# Patient Record
Sex: Female | Born: 1990 | Hispanic: No | Marital: Single | Smoking: Never smoker
Health system: Southern US, Community
[De-identification: ages and names within clinical notes are randomized; demographics above are authoritative.]

## PROBLEM LIST (undated history)

## (undated) DIAGNOSIS — R42 Dizziness and giddiness: Secondary | ICD-10-CM

## (undated) DIAGNOSIS — E236 Other disorders of pituitary gland: Secondary | ICD-10-CM

## (undated) DIAGNOSIS — E221 Hyperprolactinemia: Secondary | ICD-10-CM

## (undated) DIAGNOSIS — R519 Headache, unspecified: Secondary | ICD-10-CM

## (undated) HISTORY — DX: Other disorders of pituitary gland: E23.6

## (undated) HISTORY — DX: Dizziness and giddiness: R42

## (undated) HISTORY — DX: Hyperprolactinemia: E22.1

## (undated) HISTORY — DX: Headache, unspecified: R51.9

---

## 2016-04-15 ENCOUNTER — Emergency Department (HOSPITAL_BASED_OUTPATIENT_CLINIC_OR_DEPARTMENT_OTHER): Payer: BLUE CROSS/BLUE SHIELD

## 2016-04-15 ENCOUNTER — Emergency Department (HOSPITAL_BASED_OUTPATIENT_CLINIC_OR_DEPARTMENT_OTHER)
Admission: EM | Admit: 2016-04-15 | Discharge: 2016-04-15 | Disposition: A | Discharge status: Home / Self Care | Payer: BLUE CROSS/BLUE SHIELD | Attending: Emergency Medicine | Admitting: Emergency Medicine

## 2016-04-15 ENCOUNTER — Encounter (HOSPITAL_BASED_OUTPATIENT_CLINIC_OR_DEPARTMENT_OTHER): Payer: Self-pay | Admitting: *Deleted

## 2016-04-15 DIAGNOSIS — R310 Gross hematuria: Secondary | ICD-10-CM | POA: Insufficient documentation

## 2016-04-15 DIAGNOSIS — Y999 Unspecified external cause status: Secondary | ICD-10-CM | POA: Diagnosis not present

## 2016-04-15 DIAGNOSIS — Y939 Activity, unspecified: Secondary | ICD-10-CM | POA: Diagnosis not present

## 2016-04-15 DIAGNOSIS — X58XXXA Exposure to other specified factors, initial encounter: Secondary | ICD-10-CM | POA: Diagnosis not present

## 2016-04-15 DIAGNOSIS — S39012A Strain of muscle, fascia and tendon of lower back, initial encounter: Secondary | ICD-10-CM

## 2016-04-15 DIAGNOSIS — R109 Unspecified abdominal pain: Secondary | ICD-10-CM | POA: Diagnosis not present

## 2016-04-15 DIAGNOSIS — Y929 Unspecified place or not applicable: Secondary | ICD-10-CM | POA: Insufficient documentation

## 2016-04-15 DIAGNOSIS — S3992XA Unspecified injury of lower back, initial encounter: Secondary | ICD-10-CM | POA: Diagnosis present

## 2016-04-15 LAB — URINALYSIS, ROUTINE W REFLEX MICROSCOPIC
Bilirubin Urine: NEGATIVE
GLUCOSE, UA: NEGATIVE mg/dL
KETONES UR: NEGATIVE mg/dL
LEUKOCYTES UA: NEGATIVE
Nitrite: NEGATIVE
PH: 5.5 (ref 5.0–8.0)
PROTEIN: NEGATIVE mg/dL
Specific Gravity, Urine: 1.025 (ref 1.005–1.030)

## 2016-04-15 LAB — URINE MICROSCOPIC-ADD ON: WBC UA: NONE SEEN WBC/hpf (ref 0–5)

## 2016-04-15 LAB — PREGNANCY, URINE: Preg Test, Ur: NEGATIVE

## 2016-04-15 MED ORDER — IBUPROFEN 400 MG PO TABS
600.0000 mg | ORAL_TABLET | Freq: Once | ORAL | Status: AC
  Administered 2016-04-15: 600 mg via ORAL
  Filled 2016-04-15: qty 1

## 2016-04-15 MED ORDER — METHOCARBAMOL 500 MG PO TABS
1000.0000 mg | ORAL_TABLET | Freq: Once | ORAL | Status: AC
Start: 2016-04-15 — End: 2016-04-15
  Administered 2016-04-15: 1000 mg via ORAL
  Filled 2016-04-15: qty 2

## 2016-04-15 MED ORDER — IBUPROFEN 600 MG PO TABS: 600.0000 mg | ORAL_TABLET | Freq: Four times a day (QID) | ORAL | 0 refills | Status: DC | PRN

## 2016-04-15 MED ORDER — METHOCARBAMOL 500 MG PO TABS: 1000.0000 mg | ORAL_TABLET | Freq: Two times a day (BID) | ORAL | 0 refills | Status: DC

## 2016-04-15 NOTE — ED Notes (Signed)
MD at bedside. 

## 2016-04-15 NOTE — ED Provider Notes (Signed)
[facility] Note   CSN: [id number] Arrival date & time: 04/15/16  1748   By signing my name below, I, Valentino Saxon, attest that this documentation has been prepared under the direction and in the presence of Loren Racer, MD. Electronically Signed: Valentino Saxon, ED Scribe. 04/15/16. 8:03 PM.  History   Chief Complaint Chief Complaint  Patient presents with  . Hematuria   The history is provided by the patient. No language interpreter was used.    HPI Comments: Jamie Benson is a 25 y.o. female who presents to the Emergency Department complaining of moderate, constant, lower back pain onset a week ago. Pt reports having a muscle spasm about three days ago which caused her to go see a massage therapist the following day. She states having minimal relief with massage. Pt  denies any recent falls, injury, heavy lifting. Pt also reports having moderate, constant, abdominal pain with associated blood in urine onset yesterday. LMP was 04/02/2016. She states having black blood clots  after using the restroom. She denies nausea, fevers, chills and additional complaints. She also denies having a PHx of kidney stones. She does note a Fhx of kidney stones on her paternal side.  History reviewed. No pertinent past medical history.  There are no active problems to display for this patient.   History reviewed. No pertinent surgical history.  OB History    No data available       Home Medications    Prior to Admission medications   Medication Sig Start Date End Date Taking? Authorizing Provider  ibuprofen (ADVIL,MOTRIN) 600 MG tablet Take 1 tablet (600 mg total) by mouth every 6 (six) hours as needed. 04/15/16   Loren Racer, MD  methocarbamol (ROBAXIN) 500 MG tablet Take 2 tablets (1,000 mg total) by mouth 2 (two) times daily. 04/15/16   Loren Racer, MD    Family History No family history on file.  Social History Social History  Substance Use  Topics  . Smoking status: Never Smoker  . Smokeless tobacco: Never Used  . Alcohol use Yes     Allergies   Review of patient's allergies indicates no known allergies.   Review of Systems Review of Systems  Constitutional: Negative for chills and fever.  Respiratory: Negative for shortness of breath.   Cardiovascular: Negative for chest pain.  Gastrointestinal: Negative for abdominal pain, diarrhea, nausea and vomiting.  Genitourinary: Positive for flank pain and hematuria. Negative for dysuria, frequency, pelvic pain, vaginal bleeding and vaginal discharge.  Musculoskeletal: Positive for back pain and myalgias.  Neurological: Negative for dizziness, weakness, light-headedness, numbness and headaches.  All other systems reviewed and are negative.    Physical Exam Updated Vital Signs BP 123/76 (BP Location: Right Arm)   Pulse 73   Temp 98.7 F (37.1 C) (Oral)   Resp 16   Ht 5\' 4"  (1.626 m)   Wt 202 lb (91.6 kg)   LMP 04/01/2016   SpO2 99%   BMI 34.67 kg/m   Physical Exam  Constitutional: She is oriented to person, place, and time. She appears well-developed and well-nourished. No distress.  HENT:  Head: Normocephalic and atraumatic.  Mouth/Throat: Oropharynx is clear and moist.  Eyes: EOM are normal. Pupils are equal, round, and reactive to light.  Neck: Normal range of motion. Neck supple.  Cardiovascular: Normal rate and regular rhythm.   Pulmonary/Chest: Effort normal and breath sounds normal.  Abdominal: Soft. Bowel sounds are normal. There is no tenderness. There is no rebound and  no guarding.  Musculoskeletal: Normal range of motion. She exhibits no edema or tenderness.  Mild left-sided CVA tenderness to percussion. Patient does have left-sided lumbar paraspinal tenderness. No midline tenderness. No lower extremity swelling, asymmetry or tenderness. Distal pulses are equal throughout.  Neurological: She is alert and oriented to person, place, and time.  Moves  all extremities without deficit. Sensation is fully intact.  Skin: Skin is warm and dry. Capillary refill takes less than 2 seconds. No rash noted. She is not diaphoretic. No erythema.  Psychiatric: She has a normal mood and affect. Her behavior is normal.  Nursing note and vitals reviewed.    ED Treatments / Results   DIAGNOSTIC STUDIES: Oxygen Saturation is 100% on RA, normal by my interpretation.    COORDINATION OF CARE: 7:23 PM Discussed treatment plan with pt at bedside which includes Renal CT and urinalysis and pt agreed to plan.   Labs (all labs ordered are listed, but only abnormal results are displayed) Labs Reviewed  URINALYSIS, ROUTINE W REFLEX MICROSCOPIC (NOT AT [facility]) - Abnormal; Notable for the following:       Result Value   Hgb urine dipstick MODERATE (*)    All other components within normal limits  URINE MICROSCOPIC-ADD ON - Abnormal; Notable for the following:    Squamous Epithelial / LPF 0-5 (*)    Bacteria, UA RARE (*)    All other components within normal limits  PREGNANCY, URINE    EKG  EKG Interpretation None       Radiology Ct Renal Stone Study  Result Date: 04/15/2016 CLINICAL DATA:  Acute onset of left flank pain.  Initial encounter. EXAM: CT ABDOMEN AND PELVIS WITHOUT CONTRAST TECHNIQUE: Multidetector CT imaging of the abdomen and pelvis was performed following the standard protocol without IV contrast. COMPARISON:  None. FINDINGS: Lower chest: The visualized lung bases are grossly clear. The visualized portions of the mediastinum are unremarkable. Hepatobiliary: The liver is unremarkable in appearance. The gallbladder is unremarkable in appearance. The common bile duct remains normal in caliber. Pancreas: The pancreas is within normal limits. Spleen: The spleen is unremarkable in appearance. Adrenals/Urinary Tract: The adrenal glands are unremarkable in appearance. The kidneys are within normal limits. There is no evidence of hydronephrosis. No  renal or ureteral stones are identified. No perinephric stranding is seen. Stomach/Bowel: The stomach is unremarkable in appearance. The small bowel is within normal limits. The appendix is normal in caliber, without evidence of appendicitis. The colon is unremarkable in appearance. Vascular/Lymphatic: The abdominal aorta is unremarkable in appearance. The inferior vena cava is grossly unremarkable. No retroperitoneal lymphadenopathy is seen. No pelvic sidewall lymphadenopathy is identified. Reproductive: The bladder is mildly distended and within normal limits. The uterus is grossly unremarkable in appearance. The ovaries are relatively symmetric. No suspicious adnexal masses are seen. Other: No additional soft tissue abnormalities are seen. Musculoskeletal: No acute osseous abnormalities are identified. The visualized musculature is unremarkable in appearance. IMPRESSION: Unremarkable noncontrast CT of the abdomen and pelvis. Electronically Signed   By: Roanna Raider M.D.   On: 04/15/2016 20:19    Procedures Procedures (including critical care time)  Medications Ordered in ED Medications  ibuprofen (ADVIL,MOTRIN) tablet 600 mg (600 mg Oral Given 04/15/16 2047)  methocarbamol (ROBAXIN) tablet 1,000 mg (1,000 mg Oral Given 04/15/16 2047)     Initial Impression / Assessment and Plan / ED Course  I have reviewed the triage vital signs and the nursing notes.  Pertinent labs & imaging results that were available  during my care of the patient were reviewed by me and considered in my medical decision making (see chart for details).  Clinical Course   CT without evidence of renal stones. Pain is controlled in the emergency department. He appears to be having muscle spasm and pain. Unsure if related to hematuria. Patient's follow-up with her OB/GYN. Understands need to return immediately for any worsening symptoms or concerns.   Final Clinical Impressions(s) / ED Diagnoses   Final diagnoses:    Lumbar strain, initial encounter  Gross hematuria    New Prescriptions New Prescriptions   IBUPROFEN (ADVIL,MOTRIN) 600 MG TABLET    Take 1 tablet (600 mg total) by mouth every 6 (six) hours as needed.   METHOCARBAMOL (ROBAXIN) 500 MG TABLET    Take 2 tablets (1,000 mg total) by mouth 2 (two) times daily.    I personally performed the services described in this documentation, which was scribed in my presence. The recorded information has been reviewed and is accurate.       Loren Raceravid Kamori Kitchens, MD 04/15/16 2134

## 2016-04-15 NOTE — ED Notes (Signed)
L lower back pain x 1 week with hematuria starting today. No hx of kidney stones. Denies N/V, denies burning with urination or frequency.

## 2016-04-15 NOTE — ED Triage Notes (Signed)
Back and lower abdominal pain. She has seen blood in her underwear and thinks she has blood in her urine but it could be from her vagina.

## 2016-04-18 ENCOUNTER — Encounter: Payer: Self-pay | Admitting: Podiatry

## 2016-04-18 ENCOUNTER — Ambulatory Visit (INDEPENDENT_AMBULATORY_CARE_PROVIDER_SITE_OTHER): Payer: BLUE CROSS/BLUE SHIELD

## 2016-04-18 ENCOUNTER — Ambulatory Visit (INDEPENDENT_AMBULATORY_CARE_PROVIDER_SITE_OTHER): Payer: BLUE CROSS/BLUE SHIELD | Admitting: Podiatry

## 2016-04-18 DIAGNOSIS — B353 Tinea pedis: Secondary | ICD-10-CM

## 2016-04-18 DIAGNOSIS — R52 Pain, unspecified: Secondary | ICD-10-CM | POA: Diagnosis not present

## 2016-04-18 DIAGNOSIS — M722 Plantar fascial fibromatosis: Secondary | ICD-10-CM | POA: Diagnosis not present

## 2016-04-18 MED ORDER — TERBINAFINE HCL 250 MG PO TABS: 250.0000 mg | ORAL_TABLET | Freq: Every day | ORAL | 0 refills | Status: DC

## 2016-04-18 NOTE — Progress Notes (Addendum)
   Subjective:    Patient ID: Marshell GarfinkelMelissa Hallinan, female    DOB: 02/09/1991, 25 y.o.   MRN: [id number]  HPI  25 year old female presents the office they for concerns of left heel pain which is been ongoing for about 2 months and she describes as a throbbing sensation. She denies any recent injury or trauma. Denies any swelling or redness. Numbness or tingling. The pain does not wake her up at night. No claudication symptoms.  She also states that she has fungus on her skin issues on her third bottle of clotrimazole which has not been helping. She denies any drainage or pus or any redness or swelling to her feet. No other complaints at this time.   Review of Systems  All other systems reviewed and are negative.      Objective:   Physical Exam General: AAO x3, NAD  Dermatological: On the foot there is dry, peeling, erythematous skin with blistering formation present interdigitally into the sulcus of the foot. There is no drainage or pus there is no ascending cellulitis. Subjective there is itching of the area. There is no other open lesions or pre-ulcerative lesions.  Vascular: Dorsalis Pedis artery and Posterior Tibial artery pedal pulses are 2/4 bilateral with immedate capillary fill time. There is no pain with calf compression, swelling, warmth, erythema.   Neruologic: Grossly intact via light touch bilateral. Vibratory intact via tuning fork bilateral. PPatellar and Achilles deep tendon reflexes 2+ bilateral. No Babinski or clonus noted bilateral.   Musculoskeletal:Tenderness to palpation along the plantar medial tubercle of the calcaneus at the insertion of plantar fascia on the left foot. There is no pain along the course of the plantar fascia within the arch of the foot. Plantar fascia appears to be intact. There is no pain with lateral compression of the calcaneus or pain with vibratory sensation. There is no pain along the course or insertion of the achilles tendon. No other areas of  tenderness to bilateral lower extremities. Muscular strength 5/5 in all groups tested bilateral.  Gait: Unassisted, Nonantalgic.     Assessment & Plan:  25 year old female left heel pain likely plantar fasciitis; tinea pedis -Treatment options discussed including all alternatives, risks, and complications -Etiology of symptoms were discussed -X-rays were obtained and reviewed with the patient. Bone island present the fifth metatarsal base. No evidence of acute fracture. -Plantar fascial brace dispensed -Discussed steroid injection. Patient elects to proceed with steroid injection into the heel. Under sterile skin preparation, a total of 2.5cc of kenalog 10, 0.5% Marcaine plain, and 2% lidocaine plain were infiltrated into the symptomatic area without complication. A band-aid was applied. Patient tolerated the injection well without complication. Post-injection care with discussed with the patient. Discussed with the patient to ice the area over the next couple of days to help prevent a steroid flare.  -Stretching, icing exercises daily -Discussed shoe gear modifications in the next -Given that she's tried over-the-counter treatment for tinea pedis this is not resolving prescribed 2 week course of Lamisil discussed side effects the medication. -Follow-up in 4 weeks or sooner if any problems arise. In the meantime, encouraged to call the office with any questions, concerns, change in symptoms.   Ovid CurdMatthew Kimberle Stanfill, DPM

## 2016-04-18 NOTE — Patient Instructions (Signed)

## 2016-05-03 DIAGNOSIS — B353 Tinea pedis: Secondary | ICD-10-CM | POA: Insufficient documentation

## 2016-05-03 DIAGNOSIS — M722 Plantar fascial fibromatosis: Secondary | ICD-10-CM | POA: Insufficient documentation

## 2016-05-16 ENCOUNTER — Ambulatory Visit (INDEPENDENT_AMBULATORY_CARE_PROVIDER_SITE_OTHER): Payer: BLUE CROSS/BLUE SHIELD | Admitting: Podiatry

## 2016-05-16 ENCOUNTER — Ambulatory Visit: Payer: BLUE CROSS/BLUE SHIELD | Admitting: Podiatry

## 2016-05-16 ENCOUNTER — Encounter: Payer: Self-pay | Admitting: Podiatry

## 2016-05-16 DIAGNOSIS — B353 Tinea pedis: Secondary | ICD-10-CM

## 2016-05-16 DIAGNOSIS — M722 Plantar fascial fibromatosis: Secondary | ICD-10-CM

## 2016-05-16 NOTE — Progress Notes (Signed)
Subjective: 25 year old female presents the office they for follow-up evaluation of left heel pain, plantar fasciitis as well as for tinea pedis. She states that overall she is doing is also minimal discomfort to the left heel but is getting better. She has not been stretching or icing. She also states that the athletes foot is doing better. She completed 2 weeks of Lamisil any side effects. She did have 1 days she started having some itchiness in between her toes but no other symptoms. No complications after last steroid injection. Denies any systemic complaints such as fevers, chills, nausea, vomiting. No acute changes since last appointment, and no other complaints at this time.   Objective: AAO x3, NAD DP/PT pulses palpable bilaterally, CRT less than 3 seconds Tenderness to palpation along the plantar medial tubercle of the calcaneus at the insertion of plantar fascia on the left foot. There is no pain along the course of the plantar fascia within the arch of the foot. Plantar fascia appears to be intact. There is no pain with lateral compression of the calcaneus or pain with vibratory sensation. There is no pain along the course or insertion of the achilles tendon. No other areas of tenderness to bilateral lower extremities. Very minimal evidence of tinea pedis the left third interspace.  No open lesions or pre-ulcerative lesions.  No pain with calf compression, swelling, warmth, erythema  Assessment: Left heel pain/plantar fasciitis which is improving, improving tinea pedis.   Plan: -All treatment options discussed with the patient including all alternatives, risks, complications.  -Patient elects to proceed with steroid injection into the left heel. Under sterile skin preparation, a total of 2.5cc of kenalog 10, 0.5% Marcaine plain, and 2% lidocaine plain were infiltrated into the symptomatic area without complication. A band-aid was applied. Patient tolerated the injection well without  complication. Post-injection care with discussed with the patient. Discussed with the patient to ice the area over the next couple of days to help prevent a steroid flare. Continue stretching, icing exercises daily. Plantar fascial brace. -Continue over-the-counter treatment prn for tinea pedis as this is much improved. Finished 2 week course of lamisil -Follow-up in 4 weeks if symptoms continue or sooner if any problems arise. In the meantime, encouraged to call the office with any questions, concerns, change in symptoms.   Jamie CurdMatthew Benson, DPM  -Patient encouraged to call the office with any questions, concerns, change in symptoms.

## 2018-06-15 IMAGING — CT CT RENAL STONE PROTOCOL
2 of 4 series · 16 of 46 positions shown, 18 images · non-contrast
Comparison: None.

CLINICAL DATA: Acute onset of left flank pain.  Initial encounter.

EXAM:
CT ABDOMEN AND PELVIS WITHOUT CONTRAST
TECHNIQUE: Multidetector CT imaging of the abdomen and pelvis was performed
following the standard protocol without IV contrast.

[Series 2: axial st · axial · 0.88mm/px · z∈[+768,+1208]mm · 13 of 97 slices shown, 15 images]
[im 5/97  soft-tissue]
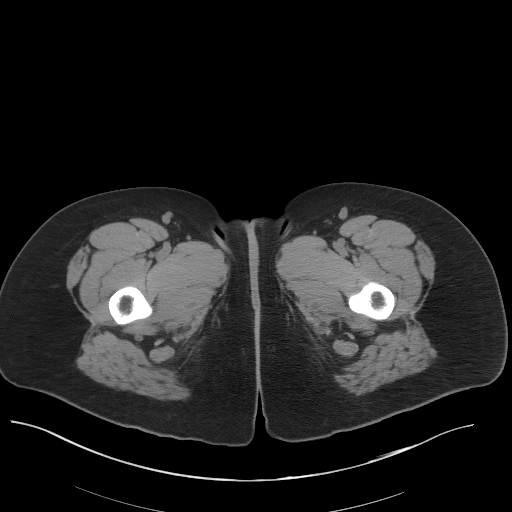
[im 5/97  bone]
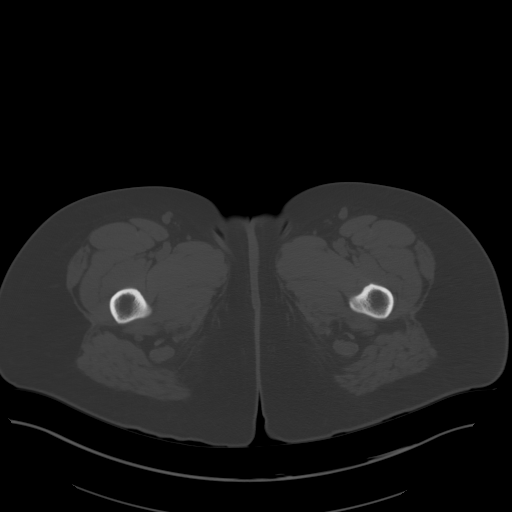
[im 13/97  soft-tissue]
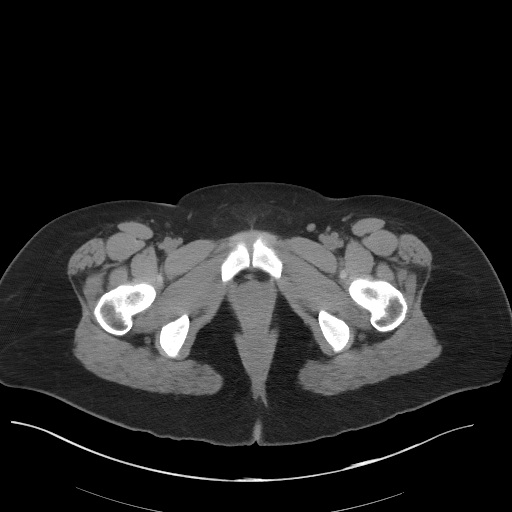
[im 21/97  soft-tissue]
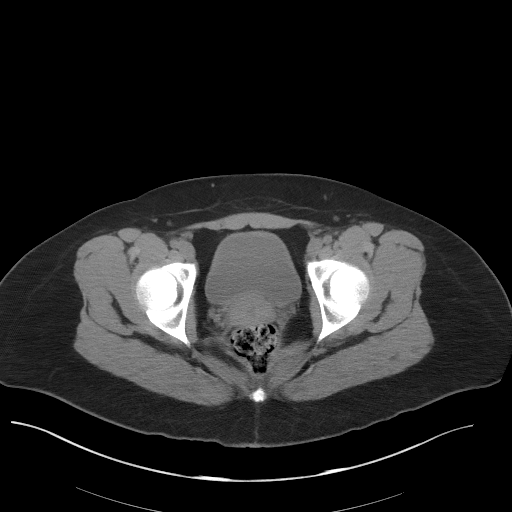
[im 29/97  soft-tissue]
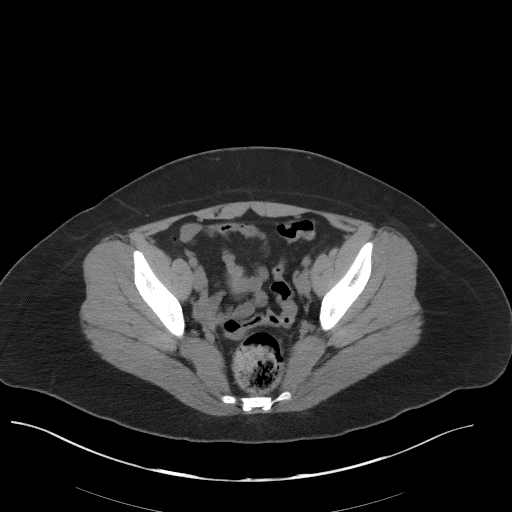
[im 33/97  soft-tissue]
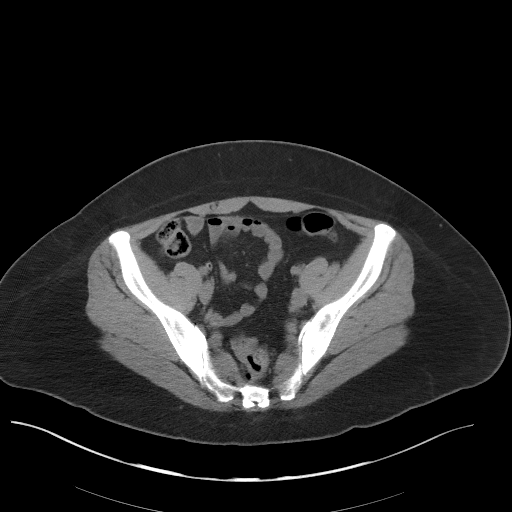
[im 41/97  soft-tissue]
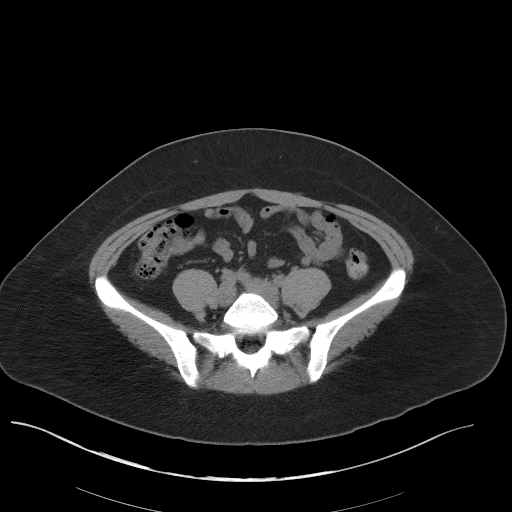
[im 49/97  soft-tissue]
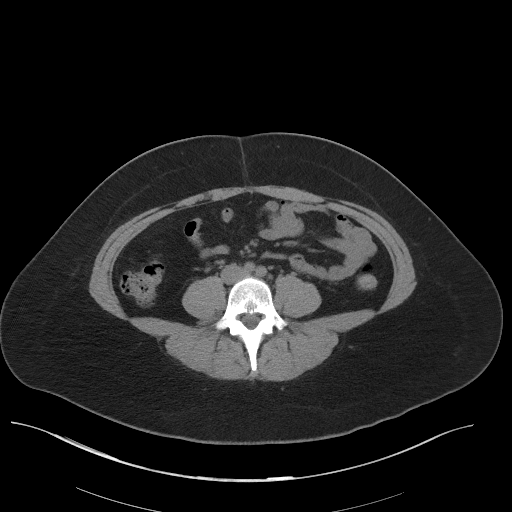
[im 57/97  soft-tissue]
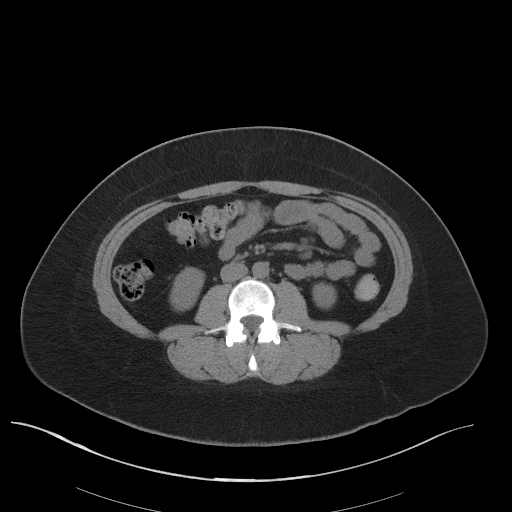
[im 65/97  soft-tissue]
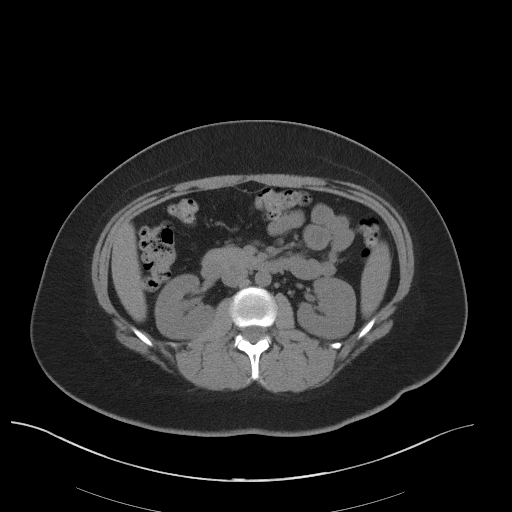
[im 65/97  bone]
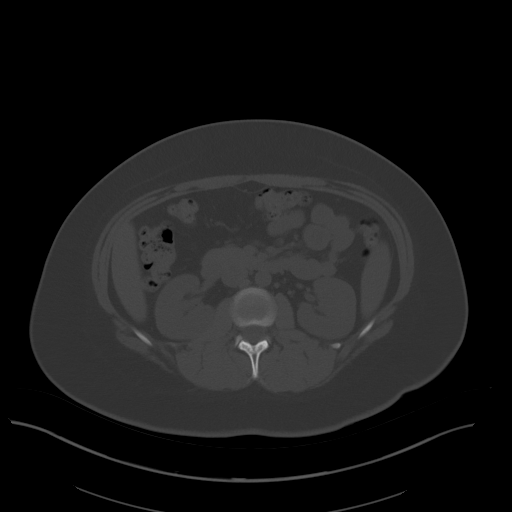
[im 69/97  soft-tissue]
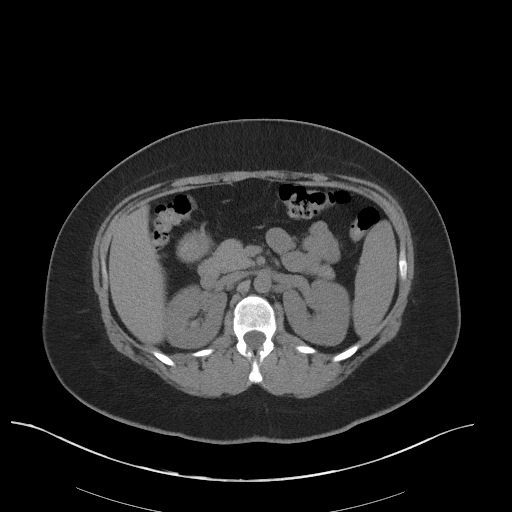
[im 77/97  soft-tissue]
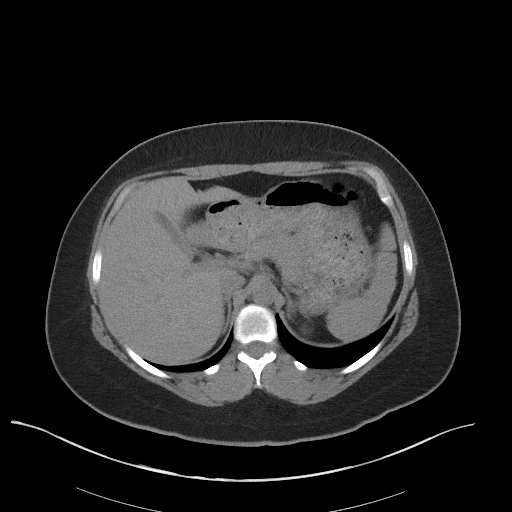
[im 85/97  soft-tissue]
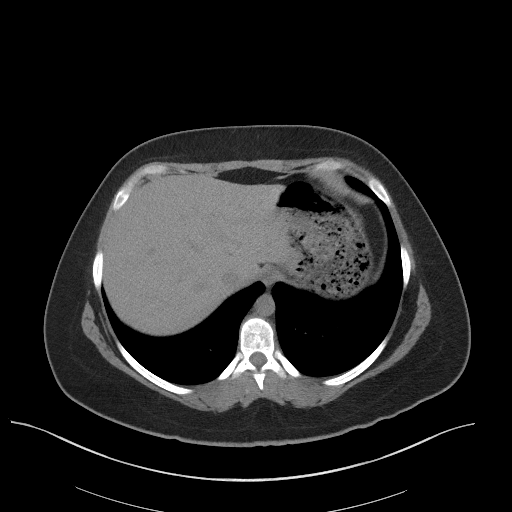
[im 93/97  soft-tissue]
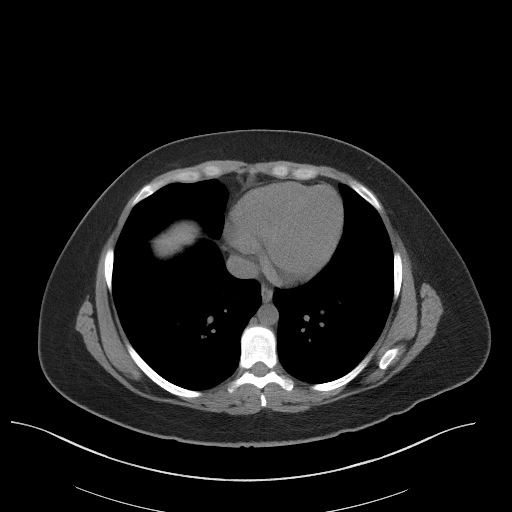

[Series 4: coronal st · coronal · 0.88mm/px · 3 of 89 slices shown]
[im 30/89  soft-tissue]
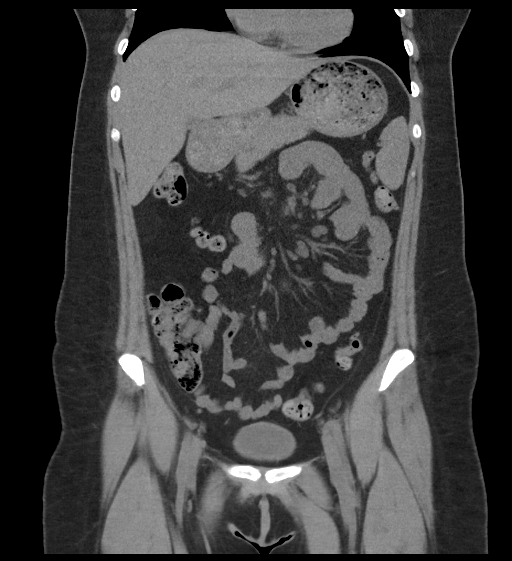
[im 40/89  soft-tissue]
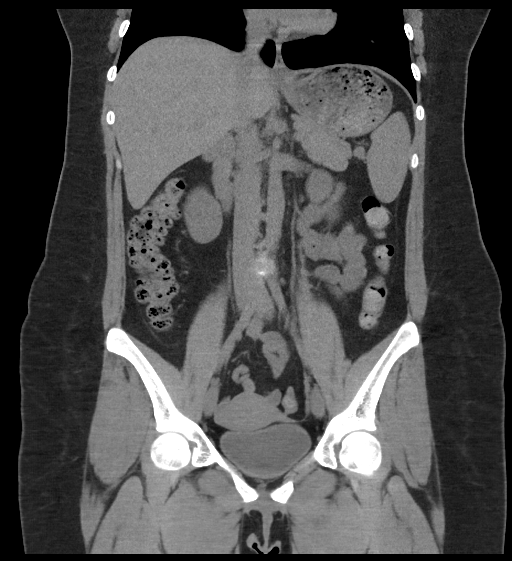
[im 49/89  soft-tissue]
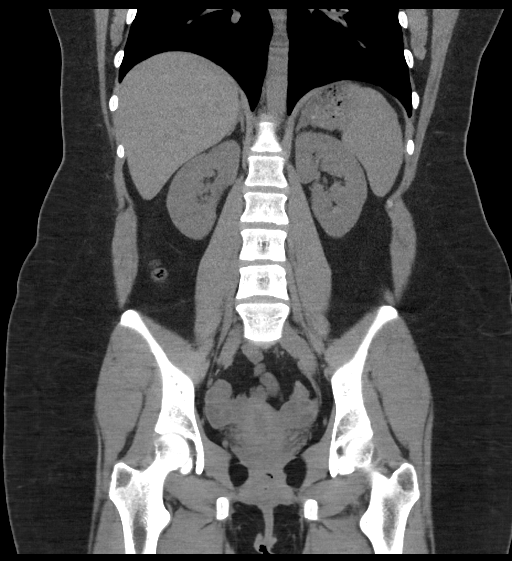

[16 of 46 positions shown; findings below may reference images not displayed]

FINDINGS: Lower chest: The visualized lung bases are grossly clear. The
visualized portions of the mediastinum are unremarkable.

Hepatobiliary: The liver is unremarkable in appearance. The
gallbladder is unremarkable in appearance. The common bile duct
remains normal in caliber.

Pancreas: The pancreas is within normal limits.

Spleen: The spleen is unremarkable in appearance.

Adrenals/Urinary Tract: The adrenal glands are unremarkable in
appearance. The kidneys are within normal limits. There is no
evidence of hydronephrosis. No renal or ureteral stones are
identified. No perinephric stranding is seen.

Stomach/Bowel: The stomach is unremarkable in appearance. The small
bowel is within normal limits. The appendix is normal in caliber,
without evidence of appendicitis. The colon is unremarkable in
appearance.

Vascular/Lymphatic: The abdominal aorta is unremarkable in
appearance. The inferior vena cava is grossly unremarkable. No
retroperitoneal lymphadenopathy is seen. No pelvic sidewall
lymphadenopathy is identified.

Reproductive: The bladder is mildly distended and within normal
limits. The uterus is grossly unremarkable in appearance. The
ovaries are relatively symmetric. No suspicious adnexal masses are
seen.

Other: No additional soft tissue abnormalities are seen.

Musculoskeletal: No acute osseous abnormalities are identified. The
visualized musculature is unremarkable in appearance.
IMPRESSION: Unremarkable noncontrast CT of the abdomen and pelvis.

## 2019-12-31 DIAGNOSIS — L814 Other melanin hyperpigmentation: Secondary | ICD-10-CM | POA: Diagnosis not present

## 2019-12-31 DIAGNOSIS — B351 Tinea unguium: Secondary | ICD-10-CM | POA: Diagnosis not present

## 2019-12-31 DIAGNOSIS — B353 Tinea pedis: Secondary | ICD-10-CM | POA: Diagnosis not present

## 2020-03-10 DIAGNOSIS — Z0000 Encounter for general adult medical examination without abnormal findings: Secondary | ICD-10-CM | POA: Diagnosis not present

## 2020-03-17 DIAGNOSIS — B353 Tinea pedis: Secondary | ICD-10-CM | POA: Diagnosis not present

## 2020-03-17 DIAGNOSIS — B351 Tinea unguium: Secondary | ICD-10-CM | POA: Diagnosis not present

## 2020-03-17 DIAGNOSIS — L814 Other melanin hyperpigmentation: Secondary | ICD-10-CM | POA: Diagnosis not present

## 2020-03-19 DIAGNOSIS — Z309 Encounter for contraceptive management, unspecified: Secondary | ICD-10-CM | POA: Diagnosis not present

## 2020-03-19 DIAGNOSIS — Z113 Encounter for screening for infections with a predominantly sexual mode of transmission: Secondary | ICD-10-CM | POA: Diagnosis not present

## 2020-03-19 DIAGNOSIS — Z6831 Body mass index (BMI) 31.0-31.9, adult: Secondary | ICD-10-CM | POA: Diagnosis not present

## 2020-03-19 DIAGNOSIS — Z01419 Encounter for gynecological examination (general) (routine) without abnormal findings: Secondary | ICD-10-CM | POA: Diagnosis not present

## 2021-04-05 DIAGNOSIS — R059 Cough, unspecified: Secondary | ICD-10-CM | POA: Diagnosis not present

## 2021-04-05 DIAGNOSIS — J301 Allergic rhinitis due to pollen: Secondary | ICD-10-CM | POA: Diagnosis not present

## 2021-05-12 DIAGNOSIS — Z01419 Encounter for gynecological examination (general) (routine) without abnormal findings: Secondary | ICD-10-CM | POA: Diagnosis not present

## 2021-05-12 DIAGNOSIS — N643 Galactorrhea not associated with childbirth: Secondary | ICD-10-CM | POA: Diagnosis not present

## 2021-05-12 DIAGNOSIS — Z6831 Body mass index (BMI) 31.0-31.9, adult: Secondary | ICD-10-CM | POA: Diagnosis not present

## 2021-05-25 DIAGNOSIS — R891 Abnormal level of hormones in specimens from other organs, systems and tissues: Secondary | ICD-10-CM | POA: Diagnosis not present

## 2021-05-27 ENCOUNTER — Other Ambulatory Visit: Payer: Self-pay | Admitting: Obstetrics and Gynecology

## 2021-05-27 DIAGNOSIS — E221 Hyperprolactinemia: Secondary | ICD-10-CM

## 2021-06-23 ENCOUNTER — Other Ambulatory Visit: Payer: Self-pay

## 2021-06-23 ENCOUNTER — Ambulatory Visit
Admission: RE | Admit: 2021-06-23 | Discharge: 2021-06-23 | Disposition: A | Discharge status: Home / Self Care | Payer: BLUE CROSS/BLUE SHIELD | Source: Ambulatory Visit | Attending: Obstetrics and Gynecology | Admitting: Obstetrics and Gynecology

## 2021-06-23 DIAGNOSIS — E221 Hyperprolactinemia: Secondary | ICD-10-CM

## 2021-06-23 MED ORDER — GADOBENATE DIMEGLUMINE 529 MG/ML IV SOLN
8.0000 mL | Freq: Once | INTRAVENOUS | Status: AC | PRN
  Administered 2021-06-23: 18:00:00 8 mL via INTRAVENOUS

## 2021-07-06 DIAGNOSIS — R059 Cough, unspecified: Secondary | ICD-10-CM | POA: Diagnosis not present

## 2021-07-06 DIAGNOSIS — J3489 Other specified disorders of nose and nasal sinuses: Secondary | ICD-10-CM | POA: Diagnosis not present

## 2021-07-19 DIAGNOSIS — E221 Hyperprolactinemia: Secondary | ICD-10-CM | POA: Diagnosis not present

## 2021-07-19 DIAGNOSIS — E236 Other disorders of pituitary gland: Secondary | ICD-10-CM | POA: Diagnosis not present

## 2021-08-18 ENCOUNTER — Encounter: Payer: Self-pay | Admitting: *Deleted

## 2021-08-23 ENCOUNTER — Encounter: Payer: Self-pay | Admitting: Psychiatry

## 2021-08-23 ENCOUNTER — Telehealth: Payer: Self-pay | Admitting: Psychiatry

## 2021-08-23 ENCOUNTER — Ambulatory Visit: Payer: BC Managed Care – PPO | Admitting: Psychiatry

## 2021-08-23 VITALS — BP 105/68 | HR 65 | Ht 64.0 in | Wt 180.6 lb

## 2021-08-23 DIAGNOSIS — E236 Other disorders of pituitary gland: Secondary | ICD-10-CM | POA: Diagnosis not present

## 2021-08-23 NOTE — Telephone Encounter (Signed)
Sent to Dr. Groat ph # [phone] 

## 2021-08-23 NOTE — Patient Instructions (Signed)
Referral to ophthalmology  Natural supplements that can reduce migraines: Magnesium Oxide or Magnesium Glycinate 500 mg at bed (up to 800 mg daily) Coenzyme Q10 300 mg in AM Vitamin B2- 200 mg twice a day  Add 1 supplement at a time since even natural supplements can have undesirable side effects. You can sometimes buy supplements cheaper (especially Coenzyme Q10) at www.[employer].co.za or at [employer].  Magnesium: Magnesium (250 mg twice a day or 500 mg at bed) has a relaxant effect on smooth muscles such as blood vessels. Individuals suffering from frequent or daily headache usually have low magnesium levels which can be increase with daily supplementation of 400-800 mg. Three trials found 40-90% average headache reduction  when used as a preventative. Magnesium also demonstrated the benefit in menstrually related migraine.  Magnesium is part of the messenger system in the serotonin cascade and it is a good muscle relaxant.  It is also useful for constipation. Good sources include nuts, whole grains, and tomatoes. Side Effects: loose stool/diarrhea  Riboflavin (vitamin B 2) 200 mg twice a day. This vitamin assists nerve cells in the production of ATP a principal energy storing molecule.  It is necessary for many chemical reactions in the body.  There have been at least 3 clinical trials of riboflavin using 400 mg per day all of which suggested that migraine frequency can be decreased.  All 3 trials showed significant improvement in over half of migraine sufferers.  The supplement is found in bread, cereal, milk, meat, and poultry.  Most Americans get more riboflavin than the recommended daily allowance, however riboflavin deficiency is not necessary for the supplements to help prevent headache. Side effects: energizing, green urine  Coenzyme Q10: This is present in almost all cells in the body and is critical component for the conversion of energy.  Recent studies have shown that a nutritional supplement of  CoQ10 can reduce the frequency of migraine attacks by improving the energy production of cells as with riboflavin.  Doses of 150 mg twice a day have been shown to be effective.

## 2021-08-23 NOTE — Progress Notes (Signed)
[facility]  PATIENT: Jamie Benson DOB: [birth year]  REFERRING CLINICIAN: Dorisann Frames, MD HISTORY FROM: self REASON FOR VISIT: empty sella   HISTORICAL  CHIEF COMPLAINT:  Chief Complaint  Patient presents with   Partial Empty Sella    Rm 1 New Pt     HISTORY OF PRESENT ILLNESS:  31 year old female who presents for evaluation of empty sella seen on MRI in December 2022. She noticed she was lactating last year so she went to get evaluated. She was found to have elevated prolactin and MRI was ordered. This showed a partially empty sella and was otherwise unremarkable.   The patient denies blurred vision, black spots, or double vision. She states she has always had frontal pressure headaches which have not worsened over time.  Headaches are worse with coughing and bending forward. She did have 6 months of bad migraines after she had COVID. Since COVID infection she has also had intermittent ringing in her left ear. Occasionally hears her heartbeat in her ears if running or exerting herself. Will sometimes feel lightheaded when she stands up.  OTHER MEDICAL CONDITIONS: none   REVIEW OF SYSTEMS: Full 14 system review of systems performed and negative with exception of: headaches, tinnitus  ALLERGIES: No Known Allergies  HOME MEDICATIONS: Outpatient Medications Prior to Visit  Medication Sig Dispense Refill   levonorgestrel (MIRENA, 52 MG,) 20 MCG/DAY IUD Mirena 21 mcg/24 hours (8 yrs) 52 mg intrauterine device  Take 1 device by intrauterine route.     Probiotic CHEW See admin instructions.     ibuprofen (ADVIL,MOTRIN) 600 MG tablet Take 1 tablet (600 mg total) by mouth every 6 (six) hours as needed. 30 tablet 0   methocarbamol (ROBAXIN) 500 MG tablet Take 2 tablets (1,000 mg total) by mouth 2 (two) times daily. 30 tablet 0   terbinafine (LAMISIL) 250 MG tablet Take 1 tablet (250 mg total) by mouth daily. 14 tablet 0   No facility-administered medications  prior to visit.    PAST MEDICAL HISTORY: Past Medical History:  Diagnosis Date   Empty sella (HCC)    Hyperprolactinemia (HCC)     PAST SURGICAL HISTORY: History reviewed. No pertinent surgical history.  FAMILY HISTORY: Family History  Problem Relation Age of Onset   Diabetes Mother    Hypercholesterolemia Father     SOCIAL HISTORY: Social History   Socioeconomic History   Marital status: Single    Spouse name: Not on file   Number of children: 0   Years of education: Not on file   Highest education level: High school graduate  Occupational History   Not on file  Tobacco Use   Smoking status: Never   Smokeless tobacco: Never  Substance and Sexual Activity   Alcohol use: Yes    Comment: 1-2 x week   Drug use: No   Sexual activity: Not on file  Other Topics Concern   Not on file  Social History Narrative   Caffeine 1 coffee a day   Social Determinants of Health   Financial Resource Strain: Not on file  Food Insecurity: Not on file  Transportation Needs: Not on file  Physical Activity: Not on file  Stress: Not on file  Social Connections: Not on file  Intimate Partner Violence: Not on file     PHYSICAL EXAM  GENERAL EXAM/CONSTITUTIONAL: Vitals:  Vitals:   08/23/21 0914  BP: 105/68  Pulse: 65  Weight: 180 lb 9.6 oz (81.9 kg)  Height: 5\' 4"  (1.626  m)   Body mass index is 31 kg/m. Wt Readings from Last 3 Encounters:  08/23/21 180 lb 9.6 oz (81.9 kg)  04/15/16 202 lb (91.6 kg)   Patient is in no distress; well developed, nourished and groomed; neck is supple  CARDIOVASCULAR: Examination of carotid arteries is normal; no carotid bruits Regular rate and rhythm, no murmurs Examination of peripheral vascular system by observation and palpation is normal  MUSCULOSKELETAL: Gait, strength, tone, movements noted in Neurologic exam below  NEUROLOGIC: MENTAL STATUS:  awake, alert, oriented to person, place and time recent and remote memory  intact normal attention and concentration  CRANIAL NERVE:  2nd - no papilledema or hemorrhages on fundoscopic exam 2nd, 3rd, 4th, 6th - pupils equal and reactive to light, visual fields full to confrontation, extraocular muscles intact, no nystagmus 5th - facial sensation symmetric 7th - facial strength symmetric 8th - hearing intact 9th - palate elevates symmetrically, uvula midline 11th - shoulder shrug symmetric 12th - tongue protrusion midline  MOTOR:  normal bulk and tone, full strength in the BUE, BLE  SENSORY:  normal and symmetric to light touch, all 4 extremities  COORDINATION:  finger-nose-finger intact bilaterally  REFLEXES:  deep tendon reflexes present and symmetric  GAIT/STATION:  normal     DIAGNOSTIC DATA (LABS, IMAGING, TESTING) - I reviewed patient records, labs, notes, testing and imaging myself where available.  MRI brain 06/23/21: 1. No evidence of pituitary mass. 2. Partial empty sella, with a mildly expanded sella. This finding is nonspecific but can be seen in the setting of idiopathic intracranial hypertension. Correlate with symptoms and consider lumbar puncture with opening pressure if clinically indicated.    ASSESSMENT AND PLAN  31 y.o. year old female who presents for evaluation of partially empty sella found on MRI brain. Her neurological exam is normal with no papilledema visualized on direct fundoscopy. Will refer to ophthalmology for a formal fundus exam to assess for subtle papilledema. Discussed that if ophthalmology exam is normal this is likely an incidental finding. If she does have evidence of papilledema will plan for CTV and LP to assess for IIH.   1. Empty sella (HCC)       PLAN: -Referral to ophthalmology for formal fundus exam -if papilledema present will plan for CTV and LP  Orders Placed This Encounter  Procedures   Ambulatory referral to Ophthalmology    No orders of the defined types were placed in this  encounter.   Return after testing.    Ocie Doyne, MD 08/23/21 9:48 AM  I spent an average of 27 minutes chart reviewing and counseling the patient, with at least 50% of the time face to face with the patient.   [facility] [address] [place], [place] [address] [phone]

## 2021-09-01 DIAGNOSIS — E236 Other disorders of pituitary gland: Secondary | ICD-10-CM | POA: Diagnosis not present

## 2021-09-06 ENCOUNTER — Encounter: Payer: Self-pay | Admitting: Psychiatry

## 2022-01-17 DIAGNOSIS — E221 Hyperprolactinemia: Secondary | ICD-10-CM | POA: Diagnosis not present

## 2022-01-17 DIAGNOSIS — E236 Other disorders of pituitary gland: Secondary | ICD-10-CM | POA: Diagnosis not present

## 2022-04-06 DIAGNOSIS — N760 Acute vaginitis: Secondary | ICD-10-CM | POA: Diagnosis not present

## 2022-04-28 ENCOUNTER — Telehealth: Payer: Self-pay | Admitting: Psychiatry

## 2022-04-28 DIAGNOSIS — G932 Benign intracranial hypertension: Secondary | ICD-10-CM | POA: Diagnosis not present

## 2022-04-28 DIAGNOSIS — F418 Other specified anxiety disorders: Secondary | ICD-10-CM | POA: Diagnosis not present

## 2022-04-28 DIAGNOSIS — R899 Unspecified abnormal finding in specimens from other organs, systems and tissues: Secondary | ICD-10-CM | POA: Diagnosis not present

## 2022-04-28 DIAGNOSIS — J301 Allergic rhinitis due to pollen: Secondary | ICD-10-CM | POA: Diagnosis not present

## 2022-04-28 DIAGNOSIS — R0602 Shortness of breath: Secondary | ICD-10-CM | POA: Diagnosis not present

## 2022-04-28 DIAGNOSIS — Z0000 Encounter for general adult medical examination without abnormal findings: Secondary | ICD-10-CM | POA: Diagnosis not present

## 2022-04-28 NOTE — Telephone Encounter (Signed)
We can have her follow up with an NP since I probably won't have any availability for a while

## 2022-04-28 NOTE — Telephone Encounter (Signed)
Called pt. Schedule appointment with Dr. Wynelle Link 11/9 @ 11am

## 2022-04-28 NOTE — Telephone Encounter (Signed)
Pt is calling stating she feels like she is going to faints and blur vision alone with shortness of breath. Pt is requesting a sooner appointment with Dr. Billey Gosling. Pt stated she was at her PCP as we speak getting an x-ray and said PCP told her to follow up with Dr. Billey Gosling.

## 2022-05-04 NOTE — Progress Notes (Unsigned)
   CC:  headaches  Follow-up Visit  Last visit: 08/23/21  Brief HPI: 31 year old female who follows in clinic for headaches. MRI brain 05/2021 showed a partially empty sella. Ophthalmology exam was negative for papilledema.  Interval History: She was doing well for several months until around 2 months ago. Around that time she developed trouble sleeping, then 1 month ago she developed lightheadedness and vertigo when standing up. This lasts for 5-10 seconds then remits. She also had one episode of blurred vision lasting for ~15 seconds. Otherwise has not had any vision changes. She developed an episode of lightheadedness and vertigo yesterday, which was followed by a severe headache with photophobia and tinnitus. Felt "out of it" the rest of the day.   Current Headache Regimen: Preventative: none Abortive: none   Prior Therapies                                  Magnesium  Physical Exam:   Vital Signs: BP 122/79   Pulse (!) 118   Ht 5\' 4"  (1.626 m)   Wt 177 lb 9.6 oz (80.6 kg)   BMI 30.48 kg/m  GENERAL:  well appearing, in no acute distress, alert  SKIN:  Color, texture, turgor normal. No rashes or lesions HEAD:  Normocephalic/atraumatic. RESP: normal respiratory effort MSK:  No gross joint deformities.   NEUROLOGICAL: Mental Status: Alert, oriented to person, place and time, Follows commands, and Speech fluent and appropriate. Cranial Nerves: optic discs sharp OU, PERRL, face symmetric, no dysarthria, hearing grossly intact Motor: moves all extremities equally Gait: normal-based.  +Dix Hallpike on left  IMPRESSION: 31 year old female without significant medical history who presents for follow up of headaches and empty sella seen on MRI. Discussed that empty sella was likely an incidental finding as her ophthalmology exam was negative for papilledema. She remains concerned because her endocrinologist reportedly told her she had IIH. Will have these notes faxed over for  review. Discussed that IIH is definitively diagnosed with a lumbar puncture, which I would not recommend unless she had clear symptoms concerning for IIH or papilledema on exam. Her vertigo is highly positional and she does have a positive Dix-Hallpike today, which is suggestive of BPPV. Will refer to vestibular therapy for vertigo management. Her most recent headache is consistent with migraine. Discussed treatment options, however she would prefer to avoid medications at this time.  PLAN: -Referral to vestibular therapy for vertigo  Follow-up: 6 months  I spent a total of 28 minutes on the date of the service. Headache education was done. Discussed treatment options including preventive and acute medications.Discussed medication side effects, adverse reactions and drug interactions. Written educational materials and patient instructions outlining all of the above were given.  34, MD 05/05/22 11:20 AM

## 2022-05-05 ENCOUNTER — Ambulatory Visit: Payer: BC Managed Care – PPO | Admitting: Psychiatry

## 2022-05-05 ENCOUNTER — Encounter: Payer: Self-pay | Admitting: Psychiatry

## 2022-05-05 VITALS — BP 122/79 | HR 118 | Ht 64.0 in | Wt 177.6 lb

## 2022-05-05 DIAGNOSIS — G43009 Migraine without aura, not intractable, without status migrainosus: Secondary | ICD-10-CM

## 2022-05-05 DIAGNOSIS — R42 Dizziness and giddiness: Secondary | ICD-10-CM

## 2022-05-09 ENCOUNTER — Ambulatory Visit: Payer: Self-pay | Attending: Psychiatry | Admitting: Physical Therapy

## 2022-05-09 ENCOUNTER — Encounter: Payer: Self-pay | Admitting: Physical Therapy

## 2022-05-09 VITALS — BP 117/87 | HR 83

## 2022-05-09 DIAGNOSIS — R42 Dizziness and giddiness: Secondary | ICD-10-CM | POA: Insufficient documentation

## 2022-05-09 NOTE — Therapy (Signed)
OUTPATIENT PHYSICAL THERAPY VESTIBULAR EVALUATION     Patient Name: Jamie Benson MRN: [id number] DOB:12-21-1990, 31 y.o., female Today's Date: 05/09/2022   PT End of Session - 05/09/22 1916     Visit Number 1    Number of Visits 1    Authorization Type Generic Commercial    PT Start Time 1104    PT Stop Time 1147    PT Time Calculation (min) 43 min    Activity Tolerance Patient tolerated treatment well    Behavior During Therapy WFL for tasks assessed/performed             Past Medical History:  Diagnosis Date   Dizziness    Empty sella (HCC)    Headache    Hyperprolactinemia (HCC)    History reviewed. No pertinent surgical history. Patient Active Problem List   Diagnosis Date Noted   Plantar fasciitis 05/03/2016   Tinea pedis 05/03/2016    PCP: Inez Pilgrim, NP REFERRING PROVIDER: Ocie Doyne, MD  REFERRING DIAG:  Diagnosis  R42 (ICD-10-CM) - Vertigo    THERAPY DIAG:  Dizziness and giddiness  ONSET DATE: 04-22-22   Rationale for Evaluation and Treatment: Rehabilitation  SUBJECTIVE:   SUBJECTIVE STATEMENT: Pt reports dizziness first occurred on 04-22-22: states blurred vision started 04-25-22 - lasted for about 7-8 secs.  Occurs intermittently - had severe tinnitus on 04-28-22;  pt reports she felt her heartbeat with the tinnitus; pt also states at times she sees spots, as if she may pass out.  Pt reports she gets dizzy when she sits up from lying down and also when getting up  - as she got up from floor yesterday after and was very dizzy upon initial standing up.  Pt reports dyspnea/ shortness of breath.  Pt states she saw Dr. Delena Bali last week and was diagnosed with positional vertigo.   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  Had pain in posterior neck on day prior to appt. With Dr. Delena Bali - pain resolved  PRECAUTIONS: None  WEIGHT  BEARING RESTRICTIONS: No  FALLS: Has patient fallen in last 6 months? No  LIVING ENVIRONMENT: Lives with: lives with their family Lives in: House/apartment  PLOF: Independent  PATIENT GOALS: resolve the vertigo  OBJECTIVE:   DIAGNOSTIC FINDINGS: MRI brain 05/2021 showed a partially empty sella. Ophthalmology exam was negative for papilledema.  COGNITION: Overall cognitive status: Within functional limits for tasks assessed   SENSATION: WFL  Cervical ROM:  WNL's   STRENGTH: WNL's  BED MOBILITY:  Independent  TRANSFERS: Assistive device utilized: None  Sit to stand: Complete Independence Stand to sit: Complete Independence GAIT: Gait pattern: WFL Distance walked: 38' Assistive device utilized: None Level of assistance: Complete Independence   VESTIBULAR ASSESSMENT:  GENERAL OBSERVATION: pt is a 31 yr old female with c/o dizziness/light-headedness with sit to stand and with supine to sit transfers.  Pt reports visual changes with seeing spots at times, tinnitus with feel of heartbeat;     SYMPTOM BEHAVIOR:  Subjective history: looking up or down provokes dizziness  Non-Vestibular symptoms: changes in vision, tinnitus, migraine symptoms, and headaches and dyspnea  Type of dizziness: Spinning/Vertigo and Lightheadedness/Faint  Frequency: depends on the movement  Duration: secs to minutes  Aggravating factors: Induced by position change: sit to stand  Relieving factors: head stationary  Progression of symptoms: unchanged  OCULOMOTOR EXAM:  Ocular Alignment: normal  Ocular ROM: No  Limitations  Spontaneous Nystagmus: absent  Gaze-Induced Nystagmus: absent  Smooth Pursuits: intact  Saccades: intact   FRENZEL - FIXATION SUPRESSED:  Ocular Alignment: normal  Spontaneous Nystagmus: absent  Gaze-Induced Nystagmus: absent   Positional tests: Right Dix-Hallpike: no nystagmus Left Dix-Hallpike: no nystagmus   Moderate c/o lightheadedness with return to upright  sitting from each test position      POSITIONAL TESTING: see above - Dix-Hallpike tests (-) for nystagmus and c/o vertigo  MOTION SENSITIVITY:  Motion Sensitivity Quotient Intensity: 0 = none, 1 = Lightheaded, 2 = Mild, 3 = Moderate, 4 = Severe, 5 = Vomiting  Intensity  1. Sitting to supine 0  2. Supine to L side   3. Supine to R side   4. Supine to sitting 3  5. L Hallpike-Dix 0  6. Up from L  3  7. R Hallpike-Dix 0  8. Up from R  3  9. Sitting, head tipped to L knee   10. Head up from L knee   11. Sitting, head tipped to R knee   12. Head up from R knee   13. Sitting head turns x5   14.Sitting head nods x5   15. In stance, 180 turn to L    16. In stance, 180 turn to R     OTHOSTATICS: see below  Vestibular Assessment - 05/09/22 1134       Orthostatics   BP supine (x 5 minutes) 115/83    HR supine (x 5 minutes) 70    BP sitting 111/87   c/o light-headedness   HR sitting 84    BP standing (after 1 minute) 120/85    HR standing (after 1 minute) 111    BP standing (after 3 minutes) 115/90    HR standing (after 3 minutes) 102            PATIENT EDUCATION: Education details: eval results - explained etiology and symptoms of BPPV; no nystagmus noted; pt was instructed to follow up with PCP regarding findings that are not consistent with BPPV in today's PT evaluation Person educated: Patient Education method: Explanation Education comprehension: verbalized understanding  HOME EXERCISE PROGRAM:  N/A - eval only - symptoms not consistent with vestibular dysfunction  GOALS:  ASSESSMENT:  CLINICAL IMPRESSION: Patient is a 31 y.o. female who was seen today for physical therapy evaluation and treatment for dizziness.  No nystagmus was noted with any positional testing and pt reported  no spinning vertigo in either test position of Rt or Lt Dix-Hallpike test.  Pt did report moderate dizziness/light-headedness with supine to sit and with sit to stand. Pt does not have  signs or symptoms consistent with BPPV in today's evaluation - pt does have irregular heartbeat and c/o dyspnea with minimal activity and also reports visual changes.  I have recommended pt to follow up with PCP for further diagnostic testing to determine etiology of dizziness.  (BP readings do not indicate orthostatic hypotension, but pulse increased from 70 to 111 bpm during orthostatic assessment).    OBJECTIVE IMPAIRMENTS: dizziness.   ACTIVITY LIMITATIONS: bending, squatting, and locomotion level  PARTICIPATION LIMITATIONS: shopping  PERSONAL FACTORS: 1 comorbidity: empty sella with possible IIH?  are also affecting patient's functional outcome.   REHAB POTENTIAL: Fair Symptoms not consistent with BPPV  CLINICAL DECISION MAKING: Evolving/moderate complexity  EVALUATION COMPLEXITY: Moderate   PLAN:  PT FREQUENCY: one time visit  PT DURATION: 1 week  PLANNED INTERVENTIONS: Therapeutic exercises, Therapeutic activity, Neuromuscular re-education, Balance training, Gait  training, Patient/Family education, Self Care, and Vestibular training  PLAN FOR NEXT SESSION: N/A - eval only - no signs of BPPV noted during initial eval; pt's dizziness does not appear to be of vestibular system dysfunction   Calahan Pak, Donavan Burnet, PT 05/09/2022, 7:17 PM

## 2022-05-11 DIAGNOSIS — R008 Other abnormalities of heart beat: Secondary | ICD-10-CM | POA: Diagnosis not present

## 2022-05-11 DIAGNOSIS — R42 Dizziness and giddiness: Secondary | ICD-10-CM | POA: Diagnosis not present

## 2022-05-12 DIAGNOSIS — M9902 Segmental and somatic dysfunction of thoracic region: Secondary | ICD-10-CM | POA: Diagnosis not present

## 2022-05-12 DIAGNOSIS — M542 Cervicalgia: Secondary | ICD-10-CM | POA: Diagnosis not present

## 2022-05-12 DIAGNOSIS — M9903 Segmental and somatic dysfunction of lumbar region: Secondary | ICD-10-CM | POA: Diagnosis not present

## 2022-05-12 DIAGNOSIS — M9901 Segmental and somatic dysfunction of cervical region: Secondary | ICD-10-CM | POA: Diagnosis not present

## 2022-05-16 DIAGNOSIS — M9902 Segmental and somatic dysfunction of thoracic region: Secondary | ICD-10-CM | POA: Diagnosis not present

## 2022-05-16 DIAGNOSIS — M542 Cervicalgia: Secondary | ICD-10-CM | POA: Diagnosis not present

## 2022-05-16 DIAGNOSIS — M9903 Segmental and somatic dysfunction of lumbar region: Secondary | ICD-10-CM | POA: Diagnosis not present

## 2022-05-16 DIAGNOSIS — M9901 Segmental and somatic dysfunction of cervical region: Secondary | ICD-10-CM | POA: Diagnosis not present

## 2022-05-16 NOTE — Progress Notes (Unsigned)
Patient referred by Roddie Mc, NP for shortness of breath  Subjective:   Jamie Benson, female    DOB: 1991-01-05, 31 y.o.   MRN: [id number]   Chief Complaint  Patient presents with   Dizziness   New Patient (Initial Visit)     HPI  31 y.o. Caucasian female with exertional dyspnea, dizziness  Patient has had symptoms of dyspnea, both at rest and exertion, without chest pain for last 102 months. She also has dizziness. She was found to have empty sella, which was deemed incidental finding by Neurology, dizziness was thought to be vertigo. Incidentally, she was also found have irregular heart beat. She has been exercising regularly without much difficulty, up until mid October 2023.  She does not smoke, used cocaine in the past but has quit long time. She has passive exposure vape through her partner. She drinks alcohol occasionally, drinks 1 cup of coffee daily.    Past Medical History:  Diagnosis Date   Dizziness    Empty sella ([place])    Headache    Hyperprolactinemia (HCC)      No past surgical history on file.   Social History   Tobacco Use  Smoking Status Never  Smokeless Tobacco Never    Social History   Substance and Sexual Activity  Alcohol Use Not Currently   Comment: 1-2 x week     Family History  Problem Relation Age of Onset   Diabetes Mother    Hypercholesterolemia Father      Current Outpatient Medications:    albuterol (VENTOLIN HFA) 108 (90 Base) MCG/ACT inhaler, Inhale into the lungs every 6 (six) hours as needed., Disp: , Rfl:    levonorgestrel (MIRENA, 52 MG,) 20 MCG/DAY IUD, Mirena 21 mcg/24 hours (8 yrs) 52 mg intrauterine device  Take 1 device by intrauterine route., Disp: , Rfl:    montelukast (SINGULAIR) 10 MG tablet, Take 10 mg by mouth at bedtime. (Patient not taking: Reported on 05/05/2022), Disp: , Rfl:    Probiotic CHEW, See admin instructions., Disp: , Rfl:    Cardiovascular and other pertinent studies:  Reviewed  external labs and tests, independently interpreted  EKG 05/17/2022: Sinus arrhythmia 75 bpm    Recent labs: 04/28/2022: Glucose 82, BUN/Cr 11/0.7. EGFR 102. K 4.4. Chol 222, TG 66, HDL 54, LDL 157 TSH 1.6 normal    Review of Systems  Cardiovascular:  Positive for dyspnea on exertion. Negative for chest pain, leg swelling, palpitations and syncope.  Neurological:  Positive for dizziness.         Vitals:   05/17/22 0927 05/17/22 0930  BP:    Pulse:    Resp:    SpO2: 99% 100%   Orthostatic VS for the past 72 hrs (Last 3 readings):  Orthostatic BP Patient Position BP Location Cuff Size Orthostatic Pulse  05/17/22 0930 111/82 Standing Left Arm Large 99  05/17/22 0927 116/78 Sitting Left Arm Large 70  05/17/22 0925 116/76 Supine Left Arm Large 85     Body mass index is 31.24 kg/m. Filed Weights   05/17/22 0917  Weight: 182 lb (82.6 kg)     Objective:   Physical Exam Vitals and nursing note reviewed.  Constitutional:      General: She is not in acute distress. Neck:     Vascular: No JVD.  Cardiovascular:     Rate and Rhythm: Normal rate and regular rhythm.     Heart sounds: Normal heart sounds. No murmur heard. Pulmonary:  Effort: Pulmonary effort is normal.     Breath sounds: Normal breath sounds. No wheezing or rales.  Musculoskeletal:     Right lower leg: No edema.     Left lower leg: No edema.            Visit diagnoses:   ICD-10-CM   1. Dizziness  R42 EKG 12-Lead    2. Irregular heart beat  I49.9 PCV ECHOCARDIOGRAM COMPLETE    PCV CARDIAC STRESS TEST    LONG TERM MONITOR (3-14 DAYS)    3. Exertional dyspnea  R06.09 PCV ECHOCARDIOGRAM COMPLETE    PCV CARDIAC STRESS TEST       Orders Placed This Encounter  Procedures   PCV CARDIAC STRESS TEST   LONG TERM MONITOR (3-14 DAYS)   EKG 12-Lead   PCV ECHOCARDIOGRAM COMPLETE     Medication changes this visit: Medications Discontinued During This Encounter  Medication Reason    montelukast (SINGULAIR) 10 MG tablet        Assessment & Recommendations:    31 y.o. Caucasian female with exertional dyspnea, dizziness  Sinus arrhythmia on EKG. Normal physical exam. Orthostatics negative., No POTS.  Overall suspicion of cardiac etiology is low. Exercise treadmill stress test, echocardiogram, cardiac telemetry should rule out any obvious cardiac cause. I suspect she could have asthma or reactive airway syndrome. Defer workup to primary care.  Further recommendations after above testing.   F/u as needed unless significant abnormalities found on above testing  Thank you for referring the patient to [place]. Please feel free to contact with any questions.   Nigel Mormon, MD Pager: [phone] Office: [phone]

## 2022-05-17 ENCOUNTER — Other Ambulatory Visit: Payer: BC Managed Care – PPO

## 2022-05-17 ENCOUNTER — Encounter: Payer: Self-pay | Admitting: Cardiology

## 2022-05-17 ENCOUNTER — Ambulatory Visit: Payer: BC Managed Care – PPO | Admitting: Cardiology

## 2022-05-17 VITALS — BP 129/86 | HR 86 | Resp 16 | Ht 64.0 in | Wt 182.0 lb

## 2022-05-17 DIAGNOSIS — R42 Dizziness and giddiness: Secondary | ICD-10-CM | POA: Diagnosis not present

## 2022-05-17 DIAGNOSIS — I499 Cardiac arrhythmia, unspecified: Secondary | ICD-10-CM | POA: Insufficient documentation

## 2022-05-17 DIAGNOSIS — M9903 Segmental and somatic dysfunction of lumbar region: Secondary | ICD-10-CM | POA: Diagnosis not present

## 2022-05-17 DIAGNOSIS — M9901 Segmental and somatic dysfunction of cervical region: Secondary | ICD-10-CM | POA: Diagnosis not present

## 2022-05-17 DIAGNOSIS — M542 Cervicalgia: Secondary | ICD-10-CM | POA: Diagnosis not present

## 2022-05-17 DIAGNOSIS — R0609 Other forms of dyspnea: Secondary | ICD-10-CM | POA: Diagnosis not present

## 2022-05-17 DIAGNOSIS — M9902 Segmental and somatic dysfunction of thoracic region: Secondary | ICD-10-CM | POA: Diagnosis not present

## 2022-05-18 DIAGNOSIS — M9902 Segmental and somatic dysfunction of thoracic region: Secondary | ICD-10-CM | POA: Diagnosis not present

## 2022-05-18 DIAGNOSIS — M542 Cervicalgia: Secondary | ICD-10-CM | POA: Diagnosis not present

## 2022-05-18 DIAGNOSIS — M9903 Segmental and somatic dysfunction of lumbar region: Secondary | ICD-10-CM | POA: Diagnosis not present

## 2022-05-18 DIAGNOSIS — M9901 Segmental and somatic dysfunction of cervical region: Secondary | ICD-10-CM | POA: Diagnosis not present

## 2022-05-23 DIAGNOSIS — M9903 Segmental and somatic dysfunction of lumbar region: Secondary | ICD-10-CM | POA: Diagnosis not present

## 2022-05-23 DIAGNOSIS — M9902 Segmental and somatic dysfunction of thoracic region: Secondary | ICD-10-CM | POA: Diagnosis not present

## 2022-05-23 DIAGNOSIS — M542 Cervicalgia: Secondary | ICD-10-CM | POA: Diagnosis not present

## 2022-05-23 DIAGNOSIS — M9901 Segmental and somatic dysfunction of cervical region: Secondary | ICD-10-CM | POA: Diagnosis not present

## 2022-05-25 DIAGNOSIS — M542 Cervicalgia: Secondary | ICD-10-CM | POA: Diagnosis not present

## 2022-05-25 DIAGNOSIS — M9902 Segmental and somatic dysfunction of thoracic region: Secondary | ICD-10-CM | POA: Diagnosis not present

## 2022-05-25 DIAGNOSIS — M9903 Segmental and somatic dysfunction of lumbar region: Secondary | ICD-10-CM | POA: Diagnosis not present

## 2022-05-25 DIAGNOSIS — M9901 Segmental and somatic dysfunction of cervical region: Secondary | ICD-10-CM | POA: Diagnosis not present

## 2022-05-26 DIAGNOSIS — M9902 Segmental and somatic dysfunction of thoracic region: Secondary | ICD-10-CM | POA: Diagnosis not present

## 2022-05-26 DIAGNOSIS — M542 Cervicalgia: Secondary | ICD-10-CM | POA: Diagnosis not present

## 2022-05-26 DIAGNOSIS — Z113 Encounter for screening for infections with a predominantly sexual mode of transmission: Secondary | ICD-10-CM | POA: Diagnosis not present

## 2022-05-26 DIAGNOSIS — Z01419 Encounter for gynecological examination (general) (routine) without abnormal findings: Secondary | ICD-10-CM | POA: Diagnosis not present

## 2022-05-26 DIAGNOSIS — M9901 Segmental and somatic dysfunction of cervical region: Secondary | ICD-10-CM | POA: Diagnosis not present

## 2022-05-26 DIAGNOSIS — M9903 Segmental and somatic dysfunction of lumbar region: Secondary | ICD-10-CM | POA: Diagnosis not present

## 2022-05-26 DIAGNOSIS — Z6830 Body mass index (BMI) 30.0-30.9, adult: Secondary | ICD-10-CM | POA: Diagnosis not present

## 2022-05-26 DIAGNOSIS — Z124 Encounter for screening for malignant neoplasm of cervix: Secondary | ICD-10-CM | POA: Diagnosis not present

## 2022-05-26 DIAGNOSIS — B3732 Chronic candidiasis of vulva and vagina: Secondary | ICD-10-CM | POA: Diagnosis not present

## 2022-05-26 DIAGNOSIS — N898 Other specified noninflammatory disorders of vagina: Secondary | ICD-10-CM | POA: Diagnosis not present

## 2022-05-30 ENCOUNTER — Other Ambulatory Visit: Payer: BC Managed Care – PPO

## 2022-05-30 DIAGNOSIS — M9902 Segmental and somatic dysfunction of thoracic region: Secondary | ICD-10-CM | POA: Diagnosis not present

## 2022-05-30 DIAGNOSIS — M9901 Segmental and somatic dysfunction of cervical region: Secondary | ICD-10-CM | POA: Diagnosis not present

## 2022-05-30 DIAGNOSIS — M9903 Segmental and somatic dysfunction of lumbar region: Secondary | ICD-10-CM | POA: Diagnosis not present

## 2022-05-30 DIAGNOSIS — M542 Cervicalgia: Secondary | ICD-10-CM | POA: Diagnosis not present

## 2022-06-01 DIAGNOSIS — M9903 Segmental and somatic dysfunction of lumbar region: Secondary | ICD-10-CM | POA: Diagnosis not present

## 2022-06-01 DIAGNOSIS — M9901 Segmental and somatic dysfunction of cervical region: Secondary | ICD-10-CM | POA: Diagnosis not present

## 2022-06-01 DIAGNOSIS — M542 Cervicalgia: Secondary | ICD-10-CM | POA: Diagnosis not present

## 2022-06-01 DIAGNOSIS — M9902 Segmental and somatic dysfunction of thoracic region: Secondary | ICD-10-CM | POA: Diagnosis not present

## 2022-06-03 DIAGNOSIS — M9903 Segmental and somatic dysfunction of lumbar region: Secondary | ICD-10-CM | POA: Diagnosis not present

## 2022-06-03 DIAGNOSIS — M9901 Segmental and somatic dysfunction of cervical region: Secondary | ICD-10-CM | POA: Diagnosis not present

## 2022-06-03 DIAGNOSIS — M9902 Segmental and somatic dysfunction of thoracic region: Secondary | ICD-10-CM | POA: Diagnosis not present

## 2022-06-03 DIAGNOSIS — M542 Cervicalgia: Secondary | ICD-10-CM | POA: Diagnosis not present

## 2022-06-06 DIAGNOSIS — M542 Cervicalgia: Secondary | ICD-10-CM | POA: Diagnosis not present

## 2022-06-06 DIAGNOSIS — M9902 Segmental and somatic dysfunction of thoracic region: Secondary | ICD-10-CM | POA: Diagnosis not present

## 2022-06-06 DIAGNOSIS — M9901 Segmental and somatic dysfunction of cervical region: Secondary | ICD-10-CM | POA: Diagnosis not present

## 2022-06-06 DIAGNOSIS — M9903 Segmental and somatic dysfunction of lumbar region: Secondary | ICD-10-CM | POA: Diagnosis not present

## 2022-06-08 DIAGNOSIS — M9902 Segmental and somatic dysfunction of thoracic region: Secondary | ICD-10-CM | POA: Diagnosis not present

## 2022-06-08 DIAGNOSIS — M9901 Segmental and somatic dysfunction of cervical region: Secondary | ICD-10-CM | POA: Diagnosis not present

## 2022-06-08 DIAGNOSIS — M9903 Segmental and somatic dysfunction of lumbar region: Secondary | ICD-10-CM | POA: Diagnosis not present

## 2022-06-08 DIAGNOSIS — M542 Cervicalgia: Secondary | ICD-10-CM | POA: Diagnosis not present

## 2022-06-20 DIAGNOSIS — I499 Cardiac arrhythmia, unspecified: Secondary | ICD-10-CM | POA: Diagnosis not present

## 2022-09-29 ENCOUNTER — Ambulatory Visit: Payer: Self-pay | Admitting: Psychiatry
